# Patient Record
Sex: Female | Born: 1995 | Race: White | Hispanic: No | Marital: Single | Smoking: Never smoker
Health system: Southern US, Community
[De-identification: ages and names within clinical notes are randomized; demographics above are authoritative.]

## PROBLEM LIST (undated history)

## (undated) HISTORY — PX: OTHER SURGICAL HISTORY: SHX169

## (undated) HISTORY — PX: WISDOM TOOTH EXTRACTION: SHX21

---

## 2019-06-30 ENCOUNTER — Emergency Department (HOSPITAL_COMMUNITY): Payer: Self-pay

## 2019-06-30 ENCOUNTER — Encounter (HOSPITAL_COMMUNITY): Payer: Self-pay | Admitting: Student

## 2019-06-30 ENCOUNTER — Emergency Department (HOSPITAL_COMMUNITY)
Admission: EM | Admit: 2019-06-30 | Discharge: 2019-06-30 | Disposition: A | Discharge status: Home / Self Care | Payer: Self-pay | Attending: Emergency Medicine | Admitting: Emergency Medicine

## 2019-06-30 ENCOUNTER — Other Ambulatory Visit: Payer: Self-pay

## 2019-06-30 DIAGNOSIS — Y929 Unspecified place or not applicable: Secondary | ICD-10-CM | POA: Insufficient documentation

## 2019-06-30 DIAGNOSIS — Y999 Unspecified external cause status: Secondary | ICD-10-CM | POA: Insufficient documentation

## 2019-06-30 DIAGNOSIS — S80211A Abrasion, right knee, initial encounter: Secondary | ICD-10-CM | POA: Insufficient documentation

## 2019-06-30 DIAGNOSIS — Y939 Activity, unspecified: Secondary | ICD-10-CM | POA: Insufficient documentation

## 2019-06-30 DIAGNOSIS — S62610A Displaced fracture of proximal phalanx of right index finger, initial encounter for closed fracture: Secondary | ICD-10-CM | POA: Insufficient documentation

## 2019-06-30 DIAGNOSIS — W19XXXA Unspecified fall, initial encounter: Secondary | ICD-10-CM

## 2019-06-30 NOTE — Discharge Instructions (Addendum)
Please read and follow all provided instructions.  You have been seen today after an injury to your right hand.  Your xray shows a fracture of your 2nd finger.  We have placed you in a splint- please keep this clean & dry and intact until you have followed up with orthopedics. Do not put any weight on this extremity Please Follow up with Dr. Amanda Pea this Thursday at 7:30 AM.   Home care instructions: -- *PRICE in the first 24-48 hours after injury: Protect with splint Rest Ice- Do not apply ice pack directly to your splint place towel or similar between your splint and ice/ice pack. Apply ice for 20 min, then remove for 40 min while awake Compression- splint Elevate affected extremity above the level of your heart when not walking around for the first 24-48 hours   Medications:  Please take motrin and or tylenol per over the counter dosing to help with pain as needed.   Follow-up instructions: Please follow-up with Dr. Amanda Pea on Thursday 07/02/19 at 7:30 AM in his office.   Return instructions:  Please return if your digits or extremity are numb or tingling, appear gray or blue, or you have severe pain (also elevate the extremity and loosen splint or wrap if you were given one) Please return if you have redness or fevers.  Please return to the Emergency Department if you experience worsening symptoms.  Please return if you have any other emergent concerns. Additional Information:  Your vital signs today were: BP 127/74 (BP Location: Left Arm)   Pulse 66   Temp 98.5 F (36.9 C) (Oral)   Resp 18   Ht 5\' 7"  (1.702 m)   Wt 71.2 kg   SpO2 99%   BMI 24.59 kg/m  If your blood pressure (BP) was elevated above 135/85 this visit, please have this repeated by your doctor within one month. ---------------
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
## 2019-06-30 NOTE — Progress Notes (Signed)
Orthopedic Tech Progress Note Patient Details:  RITTA HAMMES 1995-04-29 [id number]  Ortho Devices Type of Ortho Device: Arm sling, Other (comment) Ortho Device/Splint Location: rue long volar. Ortho Device/Splint Interventions: Ordered, Application, Adjustment   Post Interventions Patient Tolerated: Well Instructions Provided: Care of device, Adjustment of device   Trinna Post 06/30/2019, 10:45 PM

## 2021-01-16 IMAGING — DX DG HAND 2V*R*
2 series · 2 of 2 positions shown · non-contrast
Comparison: None.

CLINICAL DATA: 24-year-old female with fall and trauma to the right
hand.

EXAM:
RIGHT HAND - 2 VIEW

[hand pa]
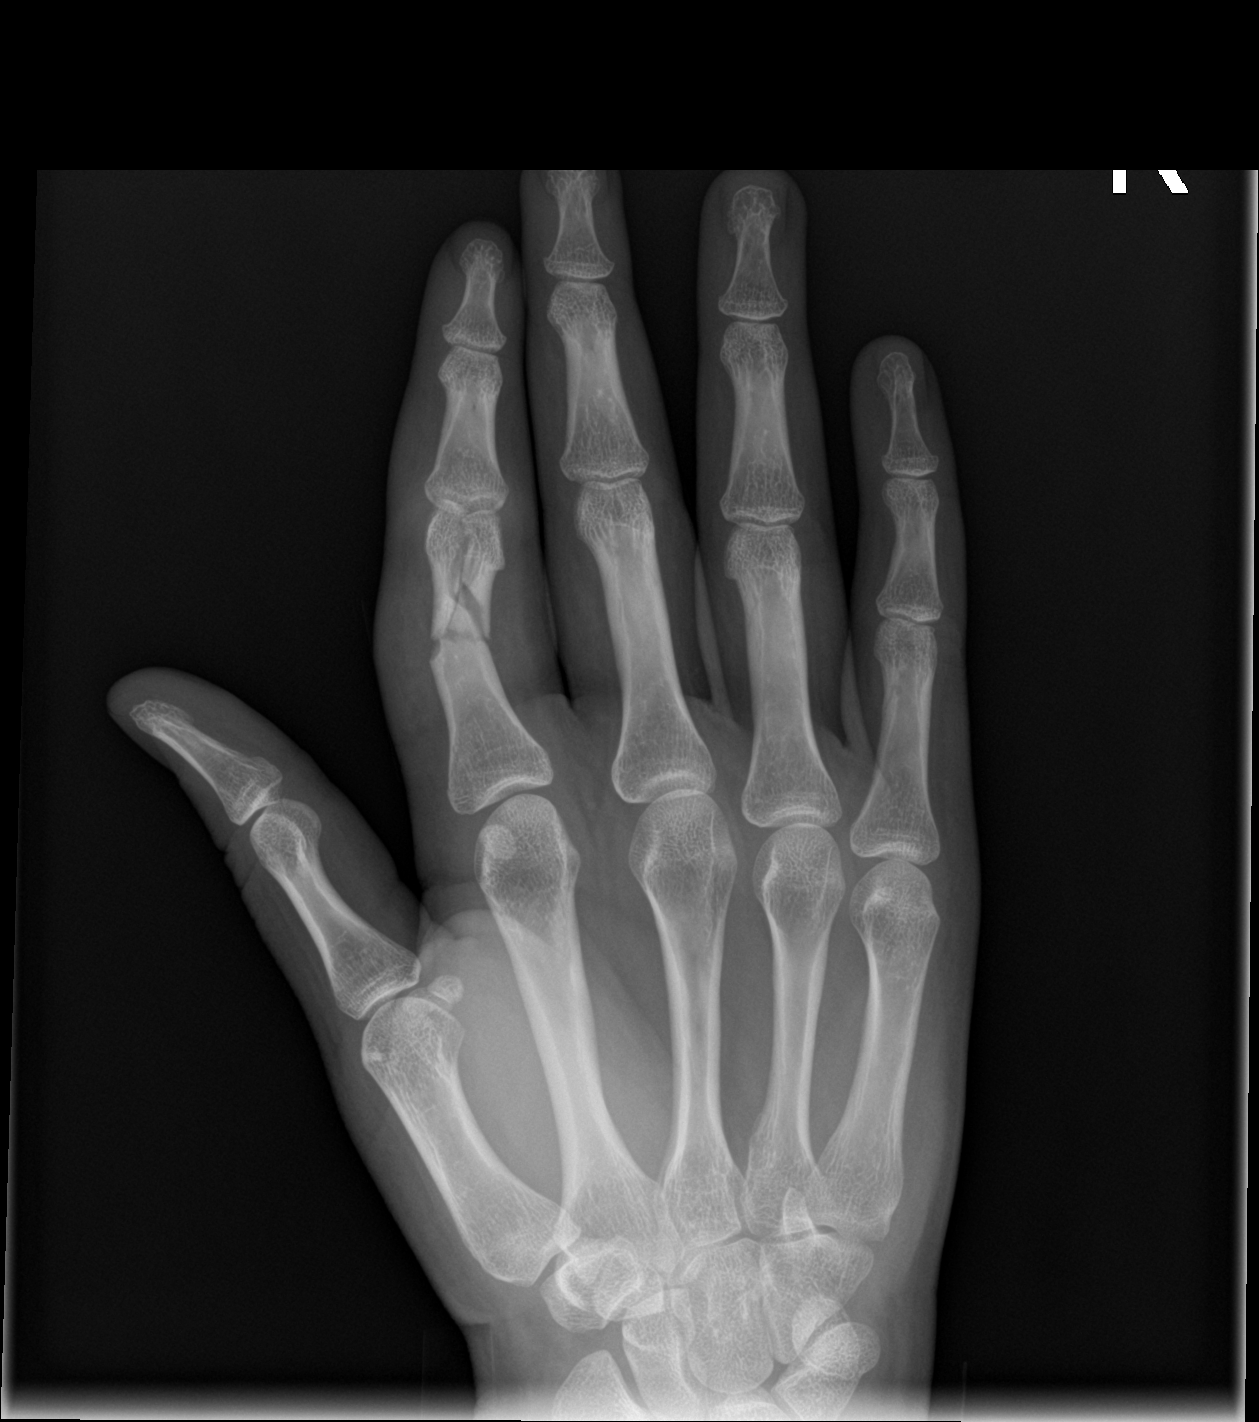

[hand lat]
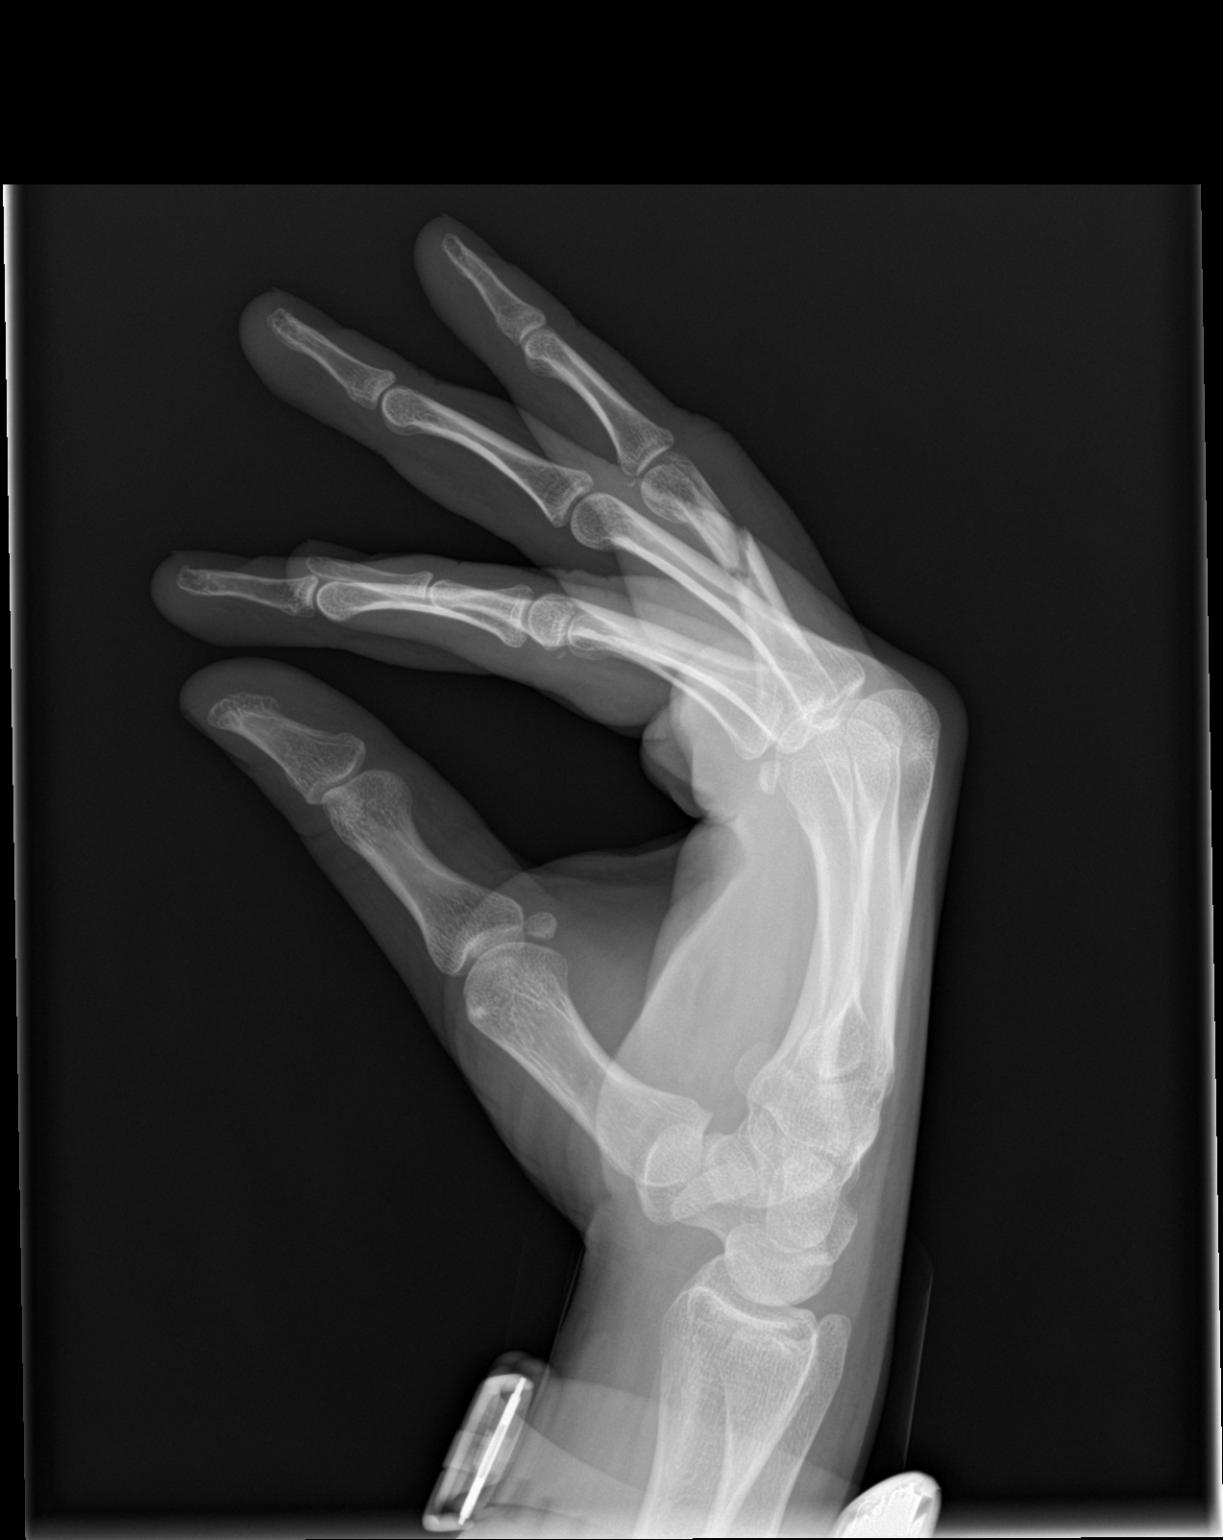

[2 of 2 positions shown; findings below may reference images not displayed]

FINDINGS: There is a comminuted mildly displaced fracture of the proximal
phalanx of the index finger with a transverse component and a
longitudinal component extending to the articular surface of the PIP
joint. There is approximately 2 mm distraction gap. There is a tiny
avulsion fracture of the volar base of the middle phalanx of the
index finger as well as an additional tiny avulsion fracture of the
volar base of middle phalanx of another digit (seen on the lateral
view). Clinical correlation is recommended.

There is no dislocation. The bones are well mineralized. No
arthritic changes. There is soft tissue swelling of the index
finger.
IMPRESSION: 1. Comminuted mildly displaced intra-articular fracture of the
proximal phalanx of the index finger.
2. Tiny avulsion fractures of the volar bases of the middle
phalanges of the index and another fingers.

## 2022-05-22 ENCOUNTER — Encounter: Payer: Self-pay | Admitting: Radiology

## 2022-06-01 ENCOUNTER — Other Ambulatory Visit (HOSPITAL_COMMUNITY)
Admission: RE | Admit: 2022-06-01 | Discharge: 2022-06-01 | Disposition: A | Discharge status: Home / Self Care | Payer: 59 | Source: Ambulatory Visit | Attending: Radiology | Admitting: Radiology

## 2022-06-01 ENCOUNTER — Encounter: Payer: Self-pay | Admitting: Radiology

## 2022-06-01 ENCOUNTER — Ambulatory Visit: Payer: 59 | Admitting: Radiology

## 2022-06-01 VITALS — BP 134/72 | Ht 66.0 in | Wt 179.0 lb

## 2022-06-01 DIAGNOSIS — Z113 Encounter for screening for infections with a predominantly sexual mode of transmission: Secondary | ICD-10-CM

## 2022-06-01 DIAGNOSIS — Z01419 Encounter for gynecological examination (general) (routine) without abnormal findings: Secondary | ICD-10-CM | POA: Diagnosis not present

## 2022-06-01 DIAGNOSIS — Z3009 Encounter for other general counseling and advice on contraception: Secondary | ICD-10-CM

## 2022-06-01 NOTE — Progress Notes (Signed)
   Mikayla Campbell 1995/07/02 [id number]   History:  27 y.o. G0 presents for annual exam as a new patient. First gyn exam. Interested in Ashley IUD for long term BC.  Gynecologic History Patient's last menstrual period was 05/12/2022 (exact date). Period Cycle (Days): 28 Period Duration (Days): 4 Period Pattern: Regular Menstrual Flow: Moderate Menstrual Control:  (menstrual cup) Dysmenorrhea: (!) Mild Contraception/Family planning: condoms Sexually active: yes Last Pap: never  Obstetric History OB History  Gravida Para Term Preterm AB Living  0 0 0 0 0 0  SAB IAB Ectopic Multiple Live Births  0 0 0 0 0     The following portions of the patient's history were reviewed and updated as appropriate: allergies, current medications, past family history, past medical history, past social history, past surgical history, and problem list.  Review of Systems Pertinent items noted in HPI and remainder of comprehensive ROS otherwise negative.   Past medical history, past surgical history, family history and social history were all reviewed and documented in the EPIC chart.   Exam:  Vitals:   06/01/22 0952  BP: 134/72  Weight: 179 lb (81.2 kg)  Height: 5\' 6"  (1.676 m)   Body mass index is 28.89 kg/m.  General appearance:  Normal Thyroid:  Symmetrical, normal in size, without palpable masses or nodularity. Respiratory  Auscultation:  Clear without wheezing or rhonchi Cardiovascular  Auscultation:  Regular rate, without rubs, murmurs or gallops  Edema/varicosities:  Not grossly evident Abdominal  Soft,nontender, without masses, guarding or rebound.  Liver/spleen:  No organomegaly noted  Hernia:  None appreciated  Skin  Inspection:  Grossly normal Breasts: Examined lying and sitting.   Right: Without masses, retractions, nipple discharge or axillary adenopathy.   Left: Without masses, retractions, nipple discharge or axillary adenopathy. Genitourinary   Inguinal/mons:   Normal without inguinal adenopathy  External genitalia:  Normal appearing vulva with no masses, tenderness, or lesions  BUS/Urethra/Skene's glands:  Normal without masses or exudate  Vagina:  Normal appearing with normal color and discharge, no lesions  Cervix:  Normal appearing without discharge or lesions  Uterus:  Normal in size, shape and contour.  Mobile, nontender  Adnexa/parametria:     Rt: Normal in size, without masses or tenderness.   Lt: Normal in size, without masses or tenderness.  Anus and perineum: Normal   Leatrice Jewels, CMA present for exam  Assessment/Plan:   1. Well woman exam with routine gynecological exam - Cytology - PAP( [place])  2. Screening for STDs (sexually transmitted diseases) - Cytology - PAP( [place])  3. Counseling for birth control regarding intrauterine device (IUD) - IUD Insertion; Future Recommended Ibuprofen 800mg  2 hours before insertion, no UPI until insertion, continue condom use     Discussed SBE, colonoscopy and DEXA screening as directed/appropriate. Recommend 14mins of exercise weekly, including weight bearing exercise. Encouraged the use of seatbelts and sunscreen. Return in 1 year for annual or as needed.   Rubbie Battiest B WHNP-BC 10:19 AM 06/01/2022

## 2022-06-05 LAB — CYTOLOGY - PAP
Chlamydia: NEGATIVE
Comment: NEGATIVE
Comment: NEGATIVE
Comment: NORMAL
Diagnosis: NEGATIVE
Neisseria Gonorrhea: NEGATIVE
Trichomonas: NEGATIVE

## 2022-06-11 ENCOUNTER — Ambulatory Visit (INDEPENDENT_AMBULATORY_CARE_PROVIDER_SITE_OTHER): Payer: 59 | Admitting: Radiology

## 2022-06-11 VITALS — BP 138/72

## 2022-06-11 DIAGNOSIS — Z01812 Encounter for preprocedural laboratory examination: Secondary | ICD-10-CM | POA: Diagnosis not present

## 2022-06-11 DIAGNOSIS — Z3009 Encounter for other general counseling and advice on contraception: Secondary | ICD-10-CM

## 2022-06-11 DIAGNOSIS — Z30430 Encounter for insertion of intrauterine contraceptive device: Secondary | ICD-10-CM

## 2022-06-11 LAB — PREGNANCY, URINE: Preg Test, Ur: NEGATIVE

## 2022-06-11 MED ORDER — PARAGARD INTRAUTERINE COPPER IU IUD: 1.0000 | INTRAUTERINE_SYSTEM | Freq: Once | INTRAUTERINE | Status: AC

## 2022-06-11 NOTE — Progress Notes (Signed)
   Mikayla Campbell 10/24/1995 [id number]   History:  27 y.o. G0 presents for insertion of Paragard IUD.  Pt has been counseled about risks and benefits as well as complications.  Consent is obtained today.  Patient's last menstrual period was 05/12/2022 (exact date). GC/CT/Trich testing: negative 06/05/22  Past medical history, past surgical history, family history and social history were all reviewed and documented in the EPIC chart.  ROS:  A ROS was performed and pertinent positives and negatives are included.  Exam: Vitals:   06/11/22 0931  BP: 138/72   There is no height or weight on file to calculate BMI.  Pelvic exam: Vulva:  normal female genitalia Vagina:  normal vagina, no discharge, exudate, lesion, or erythema Cervix:  Non-tender, Negative CMT, no lesions or redness. Uterus:  normal shape, position and consistency    Procedure:  Speculum inserted.   Cervix visualized and cleansed with Betadine x 3.  Tenaculum placed on anterior cervix. Then uterus sounded to 7.5 cm. IUD inserted easily. Strings trimmed to 3 cm.  Minimal bleeding noted.  Pt tolerated the procedure well.  Chaperone present: Leatrice Jewels, CMA   Assessment/Plan:  Insertion of Paragard IUD             UPT neg   Return for recheck 4-6 weeks Pt aware to call for any concerns Pt aware removal due no later than 06/10/2032, IUD card given to pt.   Kerry Dory WHNP-BC, 9:51 AM 06/11/2022

## 2022-07-26 DIAGNOSIS — B078 Other viral warts: Secondary | ICD-10-CM | POA: Diagnosis not present

## 2022-08-16 DIAGNOSIS — B078 Other viral warts: Secondary | ICD-10-CM | POA: Diagnosis not present

## 2022-08-24 DIAGNOSIS — B078 Other viral warts: Secondary | ICD-10-CM | POA: Diagnosis not present
